# Patient Record
Sex: Male | Born: 1960 | Race: Black or African American | Hispanic: No | Marital: Single | Smoking: Current some day smoker
Health system: Southern US, Community
[De-identification: ages and names within clinical notes are randomized; demographics above are authoritative.]

## PROBLEM LIST (undated history)

## (undated) DIAGNOSIS — I509 Heart failure, unspecified: Secondary | ICD-10-CM

---

## 2001-06-04 ENCOUNTER — Emergency Department (HOSPITAL_COMMUNITY): Admission: EM | Admit: 2001-06-04 | Discharge: 2001-06-04 | Payer: Self-pay | Admitting: *Deleted

## 2002-01-14 ENCOUNTER — Encounter: Payer: Self-pay | Admitting: Emergency Medicine

## 2002-01-14 ENCOUNTER — Emergency Department (HOSPITAL_COMMUNITY): Admission: EM | Admit: 2002-01-14 | Discharge: 2002-01-14 | Payer: Self-pay | Admitting: Emergency Medicine

## 2002-04-17 ENCOUNTER — Emergency Department (HOSPITAL_COMMUNITY): Admission: EM | Admit: 2002-04-17 | Discharge: 2002-04-17 | Payer: Self-pay | Admitting: Emergency Medicine

## 2003-07-17 ENCOUNTER — Emergency Department (HOSPITAL_COMMUNITY): Admission: EM | Admit: 2003-07-17 | Discharge: 2003-07-17 | Payer: Self-pay | Admitting: *Deleted

## 2004-07-08 ENCOUNTER — Emergency Department (HOSPITAL_COMMUNITY): Admission: EM | Admit: 2004-07-08 | Discharge: 2004-07-08 | Payer: Self-pay | Admitting: Emergency Medicine

## 2004-08-27 ENCOUNTER — Emergency Department (HOSPITAL_COMMUNITY): Admission: EM | Admit: 2004-08-27 | Discharge: 2004-08-27 | Payer: Self-pay | Admitting: Emergency Medicine

## 2007-04-23 ENCOUNTER — Emergency Department (HOSPITAL_COMMUNITY): Admission: EM | Admit: 2007-04-23 | Discharge: 2007-04-23 | Payer: Self-pay | Admitting: Emergency Medicine

## 2008-05-25 ENCOUNTER — Emergency Department (HOSPITAL_COMMUNITY): Admission: EM | Admit: 2008-05-25 | Discharge: 2008-05-25 | Payer: Self-pay | Admitting: Emergency Medicine

## 2009-02-15 ENCOUNTER — Emergency Department (HOSPITAL_COMMUNITY): Admission: EM | Admit: 2009-02-15 | Discharge: 2009-02-15 | Payer: Self-pay | Admitting: Emergency Medicine

## 2011-04-07 LAB — RAPID URINE DRUG SCREEN, HOSP PERFORMED
Amphetamines: NOT DETECTED
Barbiturates: NOT DETECTED
Benzodiazepines: NOT DETECTED
Cocaine: POSITIVE — AB
Opiates: NOT DETECTED
Tetrahydrocannabinol: NOT DETECTED

## 2011-04-07 LAB — ETHANOL: Alcohol, Ethyl (B): 227 mg/dL — ABNORMAL HIGH (ref 0–10)

## 2011-07-12 DIAGNOSIS — F17200 Nicotine dependence, unspecified, uncomplicated: Secondary | ICD-10-CM | POA: Insufficient documentation

## 2011-07-12 DIAGNOSIS — M25519 Pain in unspecified shoulder: Secondary | ICD-10-CM | POA: Insufficient documentation

## 2011-07-12 DIAGNOSIS — G90519 Complex regional pain syndrome I of unspecified upper limb: Secondary | ICD-10-CM | POA: Insufficient documentation

## 2011-07-12 DIAGNOSIS — R29898 Other symptoms and signs involving the musculoskeletal system: Secondary | ICD-10-CM | POA: Insufficient documentation

## 2011-07-13 ENCOUNTER — Encounter: Payer: Self-pay | Admitting: *Deleted

## 2011-07-13 ENCOUNTER — Emergency Department (HOSPITAL_COMMUNITY)
Admission: EM | Admit: 2011-07-13 | Discharge: 2011-07-13 | Disposition: A | Discharge status: Home / Self Care | Payer: Medicare Other | Attending: Emergency Medicine | Admitting: Emergency Medicine

## 2011-07-13 ENCOUNTER — Emergency Department (HOSPITAL_COMMUNITY): Payer: Medicare Other

## 2011-07-13 DIAGNOSIS — G90512 Complex regional pain syndrome I of left upper limb: Secondary | ICD-10-CM

## 2011-07-13 NOTE — ED Notes (Signed)
Pt wandering around department.  Was asked to return to his room.  Pt states "I'm gone. They told me I can't walk around."  Pt left prior to being seen by EDP.

## 2011-07-13 NOTE — ED Notes (Signed)
Pt c/o pain to left shoulder x few months; pt states he used to lift weights

## 2011-07-13 NOTE — ED Provider Notes (Addendum)
History    patient complains of left shoulder and left upper extremity weakness. Patient appears to be intoxicated tonight. States that he had just gotten out of prison recently. Rule out so exercises and prison which irritated his left upper extremity. His family is interested that he may have had a stroke, however patient feels that this is more musculoskeletal pain. No fever or chills or stiff neck. No obvious mental status changes. He is not slurring his words. He is moving all 4 extremities. Facial symmetry is normal. He is known to have a drinking problem at times.  Chief Complaint  Patient presents with  . Shoulder Pain   Patient is a 50 y.o. male presenting with shoulder pain. The history is provided by the patient. History Limited By: intoxication.  Shoulder Pain    Past Medical History  Diagnosis Date  . Asthma     History reviewed. No pertinent past surgical history.  History reviewed. No pertinent family history.  History  Substance Use Topics  . Smoking status: Current Some Day Smoker  . Smokeless tobacco: Not on file  . Alcohol Use: Yes      Review of Systems  All other systems reviewed and are negative.    Physical Exam  BP 121/79  Pulse 81  Temp(Src) 97.6 F (36.4 C) (Oral)  Resp 20  Ht 5\' 6"  (1.676 m)  Wt 120 lb (54.432 kg)  BMI 19.37 kg/m2  SpO2 96%  Physical Exam  Nursing note and vitals reviewed. Constitutional: He is oriented to person, place, and time. He appears well-developed and well-nourished.  HENT:  Head: Normocephalic and atraumatic.  Eyes: Conjunctivae and EOM are normal. Pupils are equal, round, and reactive to light.  Neck: Normal range of motion. Neck supple.  Cardiovascular: Normal rate and regular rhythm.   Pulmonary/Chest: Effort normal and breath sounds normal.  Abdominal: Soft. Bowel sounds are normal.  Musculoskeletal: Normal range of motion.       Able to move left upper extremity with full flexion and extension. Minimal  tenderness tenderness surrounding the rotator cuff. Neurovascular intact.  Neurological: He is alert and oriented to person, place, and time.       Patient appears intoxicated but he is alert. He is ambulatory without neurological deficits.  Skin: Skin is warm and dry.  Psychiatric: He has a normal mood and affect.    ED Course  Procedures  MDM Patient's concern about left upper extremity pain and weakness has been going on for several months. It appears to be a musculoskeletal problem. A CT of his head at the family's request. This was negative. Patient was discharged home     Donnetta Hutching, MD 07/13/11 1610  Donnetta Hutching, MD 07/13/11 [phone]

## 2011-07-13 NOTE — ED Notes (Signed)
edp in with pt 

## 2011-11-24 ENCOUNTER — Emergency Department (HOSPITAL_COMMUNITY): Payer: Medicare Other

## 2011-11-24 ENCOUNTER — Encounter (HOSPITAL_COMMUNITY): Payer: Self-pay | Admitting: *Deleted

## 2011-11-24 ENCOUNTER — Emergency Department (HOSPITAL_COMMUNITY)
Admission: EM | Admit: 2011-11-24 | Discharge: 2011-11-24 | Discharge status: Against Medical Advice | Payer: Medicare Other | Attending: Emergency Medicine | Admitting: Emergency Medicine

## 2011-11-24 DIAGNOSIS — R51 Headache: Secondary | ICD-10-CM | POA: Insufficient documentation

## 2011-11-24 DIAGNOSIS — Z139 Encounter for screening, unspecified: Secondary | ICD-10-CM

## 2011-11-24 DIAGNOSIS — F1010 Alcohol abuse, uncomplicated: Secondary | ICD-10-CM | POA: Insufficient documentation

## 2011-11-24 NOTE — ED Notes (Signed)
Pt resting comfortably with eyes closed, equal bil chest rise and fall.  nad noted.

## 2011-11-24 NOTE — ED Notes (Signed)
In via ems. Pt states his head hurts, pt intoxicated. crying

## 2011-11-24 NOTE — ED Notes (Signed)
edp in to assess pt - pt refused to talk to edp.  approx 5 min later - pt walked out of department, security called to escort pt back to bed.  Pt returned, yelling, cursing stating, " I have been here 4 hours.  I can leave and find me a doctor."  RN encouraged pt to stay and allow lab to draw blood.  Pt refused, walked out of department again.  EDP notified and wanted [employer] called to do a welfare check on pt.  Will do so.

## 2011-11-25 NOTE — ED Provider Notes (Signed)
History     CSN: [id number] Arrival date & time: 11/24/2011  2:05 PM    Chief Complaint  Patient presents with  . Alcohol Intoxication   Patient is a 50 y.o. male presenting with intoxication. The history is provided by the EMS personnel. History Limited By: uncooperative.  Alcohol Intoxication  Pt was seen at 1625.  Per EMS and pt, states he came to the ED because he felt "a knot" on his head and "wants it checked out."  States it has been there "for years."  Denies injury, no CP/SOB, no abd pain.  States he "just had a few beers" earlier today.     Past Medical History  Diagnosis Date  . Asthma     History reviewed. No pertinent past surgical history.   History  Substance Use Topics  . Smoking status: Current Some Day Smoker  . Smokeless tobacco: Not on file  . Alcohol Use: Yes    Review of Systems  Unable to perform ROS: Other    Allergies  Review of patient's allergies indicates no known allergies.  Home Medications   Current Outpatient Rx  Name Route Sig Dispense Refill  . ASPIRIN 81 MG PO TABS Oral Take 81 mg by mouth daily.        BP 115/76  Pulse 89  Temp(Src) 97.6 F (36.4 C) (Oral)  Resp 20  SpO2 98%  Physical Exam 1630: Physical examination:  Nursing notes reviewed; Vital signs and O2 SAT reviewed;  Constitutional: Well developed, Well nourished, Well hydrated, In no acute distress; Head:  Normocephalic, atraumatic; Eyes: EOMI, PERRL, No scleral icterus; ENMT: Mouth and pharynx normal, Mucous membranes moist; Neck: Supple, Full range of motion, No lymphadenopathy; Cardiovascular: Regular rate and rhythm, No murmur, rub, or gallop; Respiratory: Breath sounds clear & equal bilaterally, No rales, rhonchi, wheezes, or rub, Normal respiratory effort/excursion; Chest: Nontender, Movement normal; Abdomen: Soft, Nontender, Nondistended, Normal bowel sounds; Extremities: Pulses normal, No tenderness, No edema,.; Neuro: AA&Ox3, Major CN grossly intact. No facial  droop, speech clea. Climbs on and off stretcher by himself without difficulty and ambulates around ED with steady gait. Moves all ext well spontaneously without obvious focal motor deficits; Skin: Color normal, Warm, Dry, no rash.    ED Course  Procedures   MDM  MDM Reviewed: nursing note and vitals    1700:  Pt apparently cursed at the ED staff and walked out of the ED shortly after my eval. Security cannot find pt.  ED RN called [employer]; apparently they know pt well, they will do welfare check on pt.      Curby Carswell Allison Quarry, DO 11/26/11 1327

## 2012-12-05 ENCOUNTER — Emergency Department (HOSPITAL_COMMUNITY): Payer: Medicare Other

## 2012-12-05 ENCOUNTER — Other Ambulatory Visit: Payer: Self-pay

## 2012-12-05 ENCOUNTER — Emergency Department (HOSPITAL_COMMUNITY)
Admission: EM | Admit: 2012-12-05 | Discharge: 2012-12-05 | Discharge status: Against Medical Advice | Payer: Medicare Other | Attending: Emergency Medicine | Admitting: Emergency Medicine

## 2012-12-05 DIAGNOSIS — S0093XA Contusion of unspecified part of head, initial encounter: Secondary | ICD-10-CM

## 2012-12-05 DIAGNOSIS — S0003XA Contusion of scalp, initial encounter: Secondary | ICD-10-CM | POA: Insufficient documentation

## 2012-12-05 DIAGNOSIS — F1010 Alcohol abuse, uncomplicated: Secondary | ICD-10-CM | POA: Insufficient documentation

## 2012-12-05 DIAGNOSIS — S1093XA Contusion of unspecified part of neck, initial encounter: Secondary | ICD-10-CM | POA: Insufficient documentation

## 2012-12-05 NOTE — ED Notes (Signed)
Pt rambles about his medicaid and having a bad heart, states my heart hurts.
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
## 2012-12-05 NOTE — ED Notes (Signed)
Walked out of emergency department AMA.

## 2012-12-05 NOTE — ED Notes (Signed)
Pt up out of bed, states that he is leaving and going home, states "can't nobody make me stay here and do nothing I don't want to do," MD made aware of pt statements.  Security notified to come to pt bedside.
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
## 2012-12-05 NOTE — ED Notes (Signed)
Pt states he refuses for blood work to be drawn.

## 2012-12-05 NOTE — ED Notes (Signed)
Pt states that he takes hydrocodone for his right shoulder pain, pt is difficult to understand, states he is on disability for a bad heart, and is deaf in his left ear.
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
## 2013-06-03 ENCOUNTER — Emergency Department (HOSPITAL_COMMUNITY)
Admission: EM | Admit: 2013-06-03 | Discharge: 2013-06-03 | Discharge status: Against Medical Advice | Payer: Medicare Other | Attending: Emergency Medicine | Admitting: Emergency Medicine

## 2013-06-03 ENCOUNTER — Emergency Department (HOSPITAL_COMMUNITY): Payer: Medicare Other

## 2013-06-03 ENCOUNTER — Encounter (HOSPITAL_COMMUNITY): Payer: Self-pay

## 2013-06-03 DIAGNOSIS — Z79899 Other long term (current) drug therapy: Secondary | ICD-10-CM | POA: Insufficient documentation

## 2013-06-03 DIAGNOSIS — S30860A Insect bite (nonvenomous) of lower back and pelvis, initial encounter: Secondary | ICD-10-CM | POA: Insufficient documentation

## 2013-06-03 DIAGNOSIS — Y939 Activity, unspecified: Secondary | ICD-10-CM | POA: Insufficient documentation

## 2013-06-03 DIAGNOSIS — J45901 Unspecified asthma with (acute) exacerbation: Secondary | ICD-10-CM | POA: Insufficient documentation

## 2013-06-03 DIAGNOSIS — R0602 Shortness of breath: Secondary | ICD-10-CM | POA: Insufficient documentation

## 2013-06-03 DIAGNOSIS — R0989 Other specified symptoms and signs involving the circulatory and respiratory systems: Secondary | ICD-10-CM | POA: Insufficient documentation

## 2013-06-03 DIAGNOSIS — R0789 Other chest pain: Secondary | ICD-10-CM | POA: Insufficient documentation

## 2013-06-03 DIAGNOSIS — W57XXXA Bitten or stung by nonvenomous insect and other nonvenomous arthropods, initial encounter: Secondary | ICD-10-CM | POA: Insufficient documentation

## 2013-06-03 DIAGNOSIS — Y929 Unspecified place or not applicable: Secondary | ICD-10-CM | POA: Insufficient documentation

## 2013-06-03 DIAGNOSIS — F17200 Nicotine dependence, unspecified, uncomplicated: Secondary | ICD-10-CM | POA: Insufficient documentation

## 2013-06-03 DIAGNOSIS — R21 Rash and other nonspecific skin eruption: Secondary | ICD-10-CM | POA: Insufficient documentation

## 2013-06-03 DIAGNOSIS — R0600 Dyspnea, unspecified: Secondary | ICD-10-CM

## 2013-06-03 DIAGNOSIS — R0609 Other forms of dyspnea: Secondary | ICD-10-CM | POA: Insufficient documentation

## 2013-06-03 NOTE — ED Provider Notes (Signed)
History    This chart was scribed for [employer]. Rubin Payor, MD by Sofie Rower, ED Scribe. The patient was seen in room APA05/APA05 and the patient's care was started at 8:36PM.    CSN: [id number]  Arrival date & time 06/03/13  2002   First MD Initiated Contact with Patient 06/03/13 2036      Chief Complaint  Patient presents with  . Tick Removal  . Shortness of Breath  . Asthma    (Consider location/radiation/quality/duration/timing/severity/associated sxs/prior treatment) The history is provided by the patient. No language interpreter was used.    Evan Ferguson is a 52 y.o. male , with a hx of asthma, who presents to the Emergency Department complaining of  sudden, progressively worsening, tick bite, located at the left side of the abdomen, onset four days ago (05/30/13).  Associated symptoms include swelling and erythema located at the left side of the abdomen. The pt reports he was recently bit by a tick, four days ago, which he proceeded to remove himself, further noticing swelling and redness at the bite site today. The pt also complains of shortness of breath, which he believes, may be related to his asthma.   The pt denies fever.   The pt is a current some day smoker, in addition to drinking alcohol.   Pt does not have a PCP.   Past Medical History  Diagnosis Date  . Asthma     History reviewed. No pertinent past surgical history.  No family history on file.  History  Substance Use Topics  . Smoking status: Current Some Day Smoker  . Smokeless tobacco: Not on file  . Alcohol Use: Yes      Review of Systems  Constitutional: Negative for fever.  Respiratory: Positive for shortness of breath.   All other systems reviewed and are negative.    Allergies  Review of patient's allergies indicates no known allergies.  Home Medications   Current Outpatient Rx  Name  Route  Sig  Dispense  Refill  . albuterol (VENTOLIN HFA) 108 (90 BASE) MCG/ACT inhaler    Inhalation   Inhale 2 puffs into the lungs every 6 (six) hours as needed for wheezing or shortness of breath.           Pulse 77  Temp(Src) 98.8 F (37.1 C) (Oral)  Resp 18  Ht 5\' 5"  (1.651 m)  Wt 120 lb (54.432 kg)  BMI 19.97 kg/m2  SpO2 98%  Physical Exam  Nursing note and vitals reviewed. Constitutional: He is oriented to person, place, and time. He appears well-developed and well-nourished. No distress.  Pt is thin.   HENT:  Head: Normocephalic and atraumatic.  Eyes: EOM are normal. Pupils are equal, round, and reactive to light.  Neck: Neck supple. No tracheal deviation present.  Cardiovascular: Normal rate, regular rhythm and normal heart sounds.   Pulmonary/Chest: Effort normal and breath sounds normal. No respiratory distress.  Abdominal: Soft. He exhibits no distension.  Musculoskeletal: Normal range of motion. He exhibits no edema.  Neurological: He is alert and oriented to person, place, and time. No sensory deficit.  Skin: Skin is warm and dry. Rash noted. There is erythema.  1.5 cm raised erythematous lesion located at the left abdomen. Scab present. No fluctuance.   Psychiatric: He has a normal mood and affect. His behavior is normal.    ED Course  Procedures (including critical care time)  DIAGNOSTIC STUDIES:  COORDINATION OF CARE:   8:40 PM- Treatment plan discussed with  patient. Pt agrees with treatment.     Labs Reviewed  CBC WITH DIFFERENTIAL  TROPONIN I   No results found.   1. Tick bite   2. Dyspnea       MDM  Patient presents for tick bite and shortness of breath with occasional chest pain. Patient would have been given antibiotics for a tick bite. He was going to get to a workup for his chest pain shortness of breath. Patient left AMA the workup could be done.      I personally performed the services described in this documentation, which was scribed in my presence. The recorded information has been reviewed and is  accurate.     Juliet Rude. Rubin Payor, MD 06/03/13 [phone]

## 2013-06-03 NOTE — ED Notes (Signed)
i was getting pts ekg ready and pt must have left room. Gown on bed and pt was gone.

## 2013-06-03 NOTE — ED Notes (Signed)
Tick bite to left abdomen per pt. Swelling and redness noted.

## 2014-09-01 ENCOUNTER — Emergency Department (HOSPITAL_COMMUNITY): Payer: Medicare Other

## 2014-09-01 ENCOUNTER — Emergency Department (HOSPITAL_COMMUNITY)
Admission: EM | Admit: 2014-09-01 | Discharge: 2014-09-01 | Disposition: A | Discharge status: Home / Self Care | Payer: Medicare Other | Attending: Emergency Medicine | Admitting: Emergency Medicine

## 2014-09-01 ENCOUNTER — Encounter (HOSPITAL_COMMUNITY): Payer: Self-pay | Admitting: Emergency Medicine

## 2014-09-01 DIAGNOSIS — F17200 Nicotine dependence, unspecified, uncomplicated: Secondary | ICD-10-CM | POA: Diagnosis not present

## 2014-09-01 DIAGNOSIS — Z79899 Other long term (current) drug therapy: Secondary | ICD-10-CM | POA: Insufficient documentation

## 2014-09-01 DIAGNOSIS — R071 Chest pain on breathing: Secondary | ICD-10-CM | POA: Insufficient documentation

## 2014-09-01 DIAGNOSIS — J45909 Unspecified asthma, uncomplicated: Secondary | ICD-10-CM | POA: Insufficient documentation

## 2014-09-01 DIAGNOSIS — I509 Heart failure, unspecified: Secondary | ICD-10-CM | POA: Insufficient documentation

## 2014-09-01 DIAGNOSIS — R079 Chest pain, unspecified: Secondary | ICD-10-CM | POA: Diagnosis present

## 2014-09-01 DIAGNOSIS — R0789 Other chest pain: Secondary | ICD-10-CM

## 2014-09-01 HISTORY — DX: Heart failure, unspecified: I50.9

## 2014-09-01 LAB — TROPONIN I: Troponin I: <0.3 ng/mL (ref <0.30)

## 2014-09-01 LAB — CBC WITH DIFFERENTIAL/PLATELET
Basophils Absolute: 0 10*3/uL (ref 0.0–0.1)
Basophils Relative: 0 % (ref 0–1)
Eosinophils Absolute: 0.1 10*3/uL (ref 0.0–0.7)
Eosinophils Relative: 2 % (ref 0–5)
HCT: 44.7 % (ref 39.0–52.0)
Hemoglobin: 15.9 g/dL (ref 13.0–17.0)
Lymphocytes Relative: 27 % (ref 12–46)
Lymphs Abs: 1.6 10*3/uL (ref 0.7–4.0)
MCH: 30.8 pg (ref 26.0–34.0)
MCHC: 35.6 g/dL (ref 30.0–36.0)
MCV: 86.5 fL (ref 78.0–100.0)
Monocytes Absolute: 0.6 10*3/uL (ref 0.1–1.0)
Monocytes Relative: 10 % (ref 3–12)
Neutro Abs: 3.5 10*3/uL (ref 1.7–7.7)
Neutrophils Relative %: 61 % (ref 43–77)
Platelets: 267 10*3/uL (ref 150–400)
RBC: 5.17 MIL/uL (ref 4.22–5.81)
RDW: 13.6 % (ref 11.5–15.5)
WBC: 5.8 10*3/uL (ref 4.0–10.5)

## 2014-09-01 LAB — BASIC METABOLIC PANEL
Anion gap: 12 (ref 5–15)
BUN: 9 mg/dL (ref 6–23)
CO2: 25 mEq/L (ref 19–32)
Calcium: 9.8 mg/dL (ref 8.4–10.5)
Chloride: 103 mEq/L (ref 96–112)
Creatinine, Ser: 0.79 mg/dL (ref 0.50–1.35)
GFR calc Af Amer: >90 mL/min (ref >90)
GFR calc non Af Amer: >90 mL/min (ref >90)
Glucose, Bld: 117 mg/dL — ABNORMAL HIGH (ref 70–99)
Potassium: 4.3 mEq/L (ref 3.7–5.3)
Sodium: 140 mEq/L (ref 137–147)

## 2014-09-01 MED ORDER — OXYCODONE-ACETAMINOPHEN 5-325 MG PO TABS
1.0000 | ORAL_TABLET | Freq: Once | ORAL | Status: AC
  Administered 2014-09-01: 1 via ORAL
  Filled 2014-09-01: qty 1

## 2014-09-01 MED ORDER — NAPROXEN 500 MG PO TABS: 500.0000 mg | ORAL_TABLET | Freq: Two times a day (BID) | ORAL | Status: AC

## 2014-09-01 MED ORDER — KETOROLAC TROMETHAMINE 60 MG/2ML IM SOLN: 60.0000 mg | Freq: Once | INTRAMUSCULAR | Status: DC

## 2014-09-01 MED ORDER — CYCLOBENZAPRINE HCL 10 MG PO TABS: 10.0000 mg | ORAL_TABLET | Freq: Two times a day (BID) | ORAL | Status: AC | PRN

## 2014-09-01 NOTE — Discharge Instructions (Signed)
Chest Wall Pain Chest wall pain is pain felt in or around the chest bones and muscles. It may take up to 6 weeks to get better. It may take longer if you are active. Chest wall pain can happen on its own. Other times, things like germs, injury, coughing, or exercise can cause the pain. HOME CARE   Avoid activities that make you tired or cause pain. Try not to use your chest, belly (abdominal), or side muscles. Do not use heavy weights.  Put ice on the sore area.  Put ice in a plastic bag.  Place a towel between your skin and the bag.  Leave the ice on for 15-20 minutes for the first 2 days.  Only take medicine as told by your doctor. GET HELP RIGHT AWAY IF:   You have more pain or are very uncomfortable.  You have a fever.  Your chest pain gets worse.  You have new problems.  You feel sick to your stomach (nauseous) or throw up (vomit).  You start to sweat or feel lightheaded.  You have a cough with mucus (phlegm).  You cough up blood. MAKE SURE YOU:   Understand these instructions.  Will watch your condition.  Will get help right away if you are not doing well or get worse. Document Released: 05/25/2008 Document Revised: 02/29/2012 Document Reviewed: 08/03/2011 [facility] Patient Information 2015 [place], [place]. This information is not intended to replace advice given to you by your health care provider. Make sure you discuss any questions you have with your health care provider.   Pain is most likely in the wall of your chest. Medication for pain and muscle relaxation. Followup your regular Dr.

## 2014-09-01 NOTE — ED Notes (Signed)
Pt c/o pain to left chest that is sore. Pain is worse with movement and palpation. Symptoms x 2 days. Pt states cough and sob that he has been experiencing is chronic and no other new symptoms since pain to chest started. Denies radiation. Pt is non diaphoretic and nad at this time. Pt has prod cough noted but states cant cough hard due to increased pain to chest.

## 2014-09-01 NOTE — ED Provider Notes (Signed)
CSN: [id number]     Arrival date & time 09/01/14  1106 History   First MD Initiated Contact with Patient 09/01/14 1112    This chart was scribed for Donnetta Hutching, MD by Freida Busman, ED Scribe. This patient was seen in room APA03/APA03 and the patient's care was started 11:37 AM.  Chief Complaint  Patient presents with  . Chest Pain    The history is provided by the patient.    HPI Comments:  Evan Ferguson is a 52 y.o. male who presents to the Emergency Department complaining of moderate  left sided chest pain that started three days ago. Pain has been constant but waxes and wanes in severity, pt reports increased pain with movement. Denies h/o MI. Pt also denies associated diaphorsis, nausea and acute trouble breathing. No alleviating factors noted. Pt admits to frequent alcohol use and smokes about 1/2 ppd.   Past Medical History  Diagnosis Date  . Asthma   . CHF (congestive heart failure)    History reviewed. No pertinent past surgical history. History reviewed. No pertinent family history. History  Substance Use Topics  . Smoking status: Current Some Day Smoker    Types: Cigarettes  . Smokeless tobacco: Not on file  . Alcohol Use: Yes     Comment: beer-2-3 cans a day    Review of Systems  All other systems reviewed and are negative.   10 systems reviewed and negative other than pertinent ROS in HPI    Allergies  Review of patient's allergies indicates no known allergies.  Home Medications   Prior to Admission medications   Medication Sig Start Date End Date Taking? Authorizing Provider  albuterol (VENTOLIN HFA) 108 (90 BASE) MCG/ACT inhaler Inhale 2 puffs into the lungs every 6 (six) hours as needed for wheezing or shortness of breath.    Historical Provider, MD  cyclobenzaprine (FLEXERIL) 10 MG tablet Take 1 tablet (10 mg total) by mouth 2 (two) times daily as needed for muscle spasms. 09/01/14   Donnetta Hutching, MD  naproxen (NAPROSYN) 500 MG tablet Take 1 tablet (500  mg total) by mouth 2 (two) times daily. 09/01/14   Donnetta Hutching, MD   BP 145/95  Pulse 66  Temp(Src) 98.1 F (36.7 C) (Oral)  Resp 19  SpO2 95% Physical Exam  Nursing note and vitals reviewed. Constitutional: He is oriented to person, place, and time. He appears well-developed and well-nourished.  HENT:  Head: Normocephalic and atraumatic.  Eyes: Conjunctivae and EOM are normal. Pupils are equal, round, and reactive to light.  Neck: Normal range of motion. Neck supple.  Cardiovascular: Normal rate, regular rhythm and normal heart sounds.   Pulmonary/Chest: Effort normal and breath sounds normal.  Left chest wall tenderness  Abdominal: Soft. Bowel sounds are normal.  Musculoskeletal: Normal range of motion.  Neurological: He is alert and oriented to person, place, and time.  Skin: Skin is warm and dry.  Psychiatric: He has a normal mood and affect. His behavior is normal.    ED Course  Procedures (including critical care time)   DIAGNOSTIC STUDIES:  Oxygen Saturation is 95% on RA, adequate by my interpretation.    COORDINATION OF CARE:  11:43 AM Discussed treatment plan with pt at bedside and pt agreed to plan.  Labs Review Labs Reviewed  BASIC METABOLIC PANEL - Abnormal; Notable for the following:    Glucose, Bld 117 (*)    All other components within normal limits  CBC WITH DIFFERENTIAL  TROPONIN I  Imaging Review Dg Chest 2 View  09/01/2014   CLINICAL DATA:  Chest pain.  EXAM: CHEST  2 VIEW  COMPARISON:  None.  FINDINGS: The heart size and mediastinal contours are within normal limits. Both lungs are clear. The visualized skeletal structures are unremarkable.  IMPRESSION: Normal chest.   Electronically Signed   By: Geanie Cooley M.D.   On: 09/01/2014 12:10     EKG Interpretation   Date/Time:  Saturday September 01 2014 11:20:14 EDT Ventricular Rate:  74 PR Interval:  187 QRS Duration: 76 QT Interval:  390 QTC Calculation: 433 R Axis:   81 Text  Interpretation:  Sinus rhythm Abnrm T, consider ischemia,  anterolateral lds ST elev, probable normal early repol pattern Confirmed  by Cavin Longman  MD, Caliegh Middlekauff ([id number]) on 09/01/2014 11:46:27 AM      MDM   Final diagnoses:  Chest wall pain  Atypical chest pain most consistent with chest wall pain. Pain is worse with movement and palpation. No dyspnea, diaphoresis, nausea.  EKG, chest x-ray, troponin all negative.   Discharge medications Naprosyn 5 mg and Flexeril 10 mg I personally performed the services described in this documentation, which was scribed in my presence. The recorded information has been reviewed and is accurate.     Donnetta Hutching, MD 09/01/14 1434

## 2014-09-01 NOTE — ED Notes (Addendum)
Pt states he hurst "a little bit" but rates pain 9 on scale. In to dc. Pt requesting pain med prior to leaving. edp aware.

## 2016-04-22 ENCOUNTER — Encounter (HOSPITAL_COMMUNITY): Payer: Self-pay | Admitting: Emergency Medicine

## 2016-04-22 ENCOUNTER — Emergency Department (HOSPITAL_COMMUNITY)
Admission: EM | Admit: 2016-04-22 | Discharge: 2016-04-22 | Disposition: A | Discharge status: Home / Self Care | Payer: Medicare Other | Attending: Emergency Medicine | Admitting: Emergency Medicine

## 2016-04-22 DIAGNOSIS — F17210 Nicotine dependence, cigarettes, uncomplicated: Secondary | ICD-10-CM | POA: Diagnosis not present

## 2016-04-22 DIAGNOSIS — J45909 Unspecified asthma, uncomplicated: Secondary | ICD-10-CM | POA: Insufficient documentation

## 2016-04-22 DIAGNOSIS — S40862A Insect bite (nonvenomous) of left upper arm, initial encounter: Secondary | ICD-10-CM | POA: Insufficient documentation

## 2016-04-22 DIAGNOSIS — Y939 Activity, unspecified: Secondary | ICD-10-CM | POA: Diagnosis not present

## 2016-04-22 DIAGNOSIS — Y929 Unspecified place or not applicable: Secondary | ICD-10-CM | POA: Diagnosis not present

## 2016-04-22 DIAGNOSIS — I509 Heart failure, unspecified: Secondary | ICD-10-CM | POA: Diagnosis not present

## 2016-04-22 DIAGNOSIS — W57XXXA Bitten or stung by nonvenomous insect and other nonvenomous arthropods, initial encounter: Secondary | ICD-10-CM | POA: Insufficient documentation

## 2016-04-22 DIAGNOSIS — Y999 Unspecified external cause status: Secondary | ICD-10-CM | POA: Insufficient documentation

## 2016-04-22 NOTE — ED Notes (Signed)
Patient verbalizes understanding of wound care. And follow up care if needed. Patient out of department at this time.

## 2016-04-22 NOTE — Discharge Instructions (Signed)
Tick Bite Information °Ticks are insects that attach themselves to the skin. There are many types of ticks. Common types include wood ticks and deer ticks. Sometimes, ticks carry diseases that can make a person very ill. The most common places for ticks to attach themselves are the scalp, neck, armpits, waist, and groin.  °HOW CAN YOU PREVENT TICK BITES? °Take these steps to help prevent tick bites when you are outdoors: °· Wear long sleeves and long pants. °· Wear white clothes so you can see ticks more easily. °· Tuck your pant legs into your socks. °· If walking on a trail, stay in the middle of the trail to avoid brushing against bushes. °· Avoid walking through areas with long grass. °· Put bug spray on all skin that is showing and along boot tops, pant legs, and sleeve cuffs. °· Check clothes, hair, and skin often and before going inside. °· Brush off any ticks that are not attached. °· Take a shower or bath as soon as possible after being outdoors. °HOW SHOULD YOU REMOVE A TICK? °Ticks should be removed as soon as possible to help prevent diseases. °1. If latex gloves are available, put them on before trying to remove a tick. °2. Use tweezers to grasp the tick as close to the skin as possible. You may also use curved forceps or a tick removal tool. Grasp the tick as close to its head as possible. Avoid grasping the tick on its body. °3. Pull gently upward until the tick lets go. Do not twist the tick or jerk it suddenly. This may break off the tick's head or mouth parts. °4. Do not squeeze or crush the tick's body. This could force disease-carrying fluids from the tick into your body. °5. After the tick is removed, wash the bite area and your hands with soap and water or alcohol. °6. Apply a small amount of antiseptic cream or ointment to the bite site. °7. Wash any tools that were used. °Do not try to remove a tick by applying a hot match, petroleum jelly, or fingernail polish to the tick. These methods do  not work. They may also increase the chances of disease being spread from the tick bite. °WHEN SHOULD YOU SEEK HELP? °Contact your health care provider if you are unable to remove a tick or if a part of the tick breaks off in the skin. °After a tick bite, you need to watch for signs and symptoms of diseases that can be spread by ticks. Contact your health care provider if you develop any of the following: °· Fever. °· Rash. °· Redness and puffiness (swelling) in the area of the tick bite. °· Tender, puffy lymph glands. °· Watery poop (diarrhea). °· Weight loss. °· Cough. °· Feeling more tired than normal (fatigue). °· Muscle, joint, or bone pain. °· Belly (abdominal) pain. °· Headache. °· Change in your level of consciousness. °· Trouble walking or moving your legs. °· Loss of feeling (numbness) in the legs. °· Loss of movement (paralysis). °· Shortness of breath. °· Confusion. °· Throwing up (vomiting) many times. °  °This information is not intended to replace advice given to you by your health care provider. Make sure you discuss any questions you have with your health care provider. °  °Document Released: 03/03/2010 Document Revised: 08/09/2013 Document Reviewed: 05/17/2013 °Elsevier Interactive Patient Education ©2016 [employer] ° °

## 2016-04-22 NOTE — ED Notes (Addendum)
Pt reports found tick to left lateral axilla yesterday. Tick still noted in site. Pt denies n/v/fever. nad noted.

## 2016-04-25 NOTE — ED Provider Notes (Signed)
CSN: [id number]     Arrival date & time 04/22/16  1019 History   First MD Initiated Contact with Patient 04/22/16 1044     Chief Complaint  Patient presents with  . Tick Removal     (Consider location/radiation/quality/duration/timing/severity/associated sxs/prior Treatment) HPI   Evan Ferguson is a 55 y.o. male who presents to the Emergency Department complaining of a tick bite to left axilla.  He states he is unsure when the bite occurred but noticed the tick earlier on the day of arrival.  He has not tried to remove it.  He complains of pain at the site.  He denies rash, joint pains, fever and chills.     Past Medical History  Diagnosis Date  . Asthma   . CHF (congestive heart failure) (HCC)    History reviewed. No pertinent past surgical history. History reviewed. No pertinent family history. Social History  Substance Use Topics  . Smoking status: Current Some Day Smoker    Types: Cigarettes  . Smokeless tobacco: None  . Alcohol Use: Yes     Comment: beer-2-3 cans a day    Review of Systems  Constitutional: Negative for fever, chills, activity change and appetite change.  Respiratory: Negative for shortness of breath.   Musculoskeletal: Negative for arthralgias.  Skin: Negative for rash and wound.       Tick bite left axilla   Neurological: Negative for dizziness, weakness, numbness and headaches.  All other systems reviewed and are negative.     Allergies  Review of patient's allergies indicates no known allergies.  Home Medications   Prior to Admission medications   Medication Sig Start Date End Date Taking? Authorizing Provider  albuterol (VENTOLIN HFA) 108 (90 BASE) MCG/ACT inhaler Inhale 2 puffs into the lungs every 6 (six) hours as needed for wheezing or shortness of breath.    Historical Provider, MD  cyclobenzaprine (FLEXERIL) 10 MG tablet Take 1 tablet (10 mg total) by mouth 2 (two) times daily as needed for muscle spasms. 09/01/14   Donnetta Hutching, MD   naproxen (NAPROSYN) 500 MG tablet Take 1 tablet (500 mg total) by mouth 2 (two) times daily. 09/01/14   Donnetta Hutching, MD   BP 118/95 mmHg  Pulse 70  Temp(Src) 98.5 F (36.9 C) (Oral)  Resp 18  Ht  (1.575 m)  Wt 54.432 kg  BMI 21.94 kg/m2  SpO2 97% Physical Exam  Constitutional: He is oriented to person, place, and time. He appears well-developed and well-nourished. No distress.  HENT:  Head: Normocephalic and atraumatic.  Mouth/Throat: Oropharynx is clear and moist.  Neck: Normal range of motion.  Cardiovascular: Normal rate, regular rhythm, normal heart sounds and intact distal pulses.   No murmur heard. Pulmonary/Chest: Effort normal and breath sounds normal. No respiratory distress.  Musculoskeletal: Normal range of motion. He exhibits no edema or tenderness.  Lymphadenopathy:    He has no cervical adenopathy.  Neurological: He is alert and oriented to person, place, and time. He exhibits normal muscle tone. Coordination normal.  Skin: Skin is warm. No rash noted.  Tick attached to skin at the left axilla.  No significant surrounding erythema  Psychiatric: He has a normal mood and affect.  Nursing note and vitals reviewed.   ED Course  Procedures (including critical care time) Labs Review Labs Reviewed - No data to display  Imaging Review No results found. I have personally reviewed and evaluated these images and lab results as part of my medical decision-making.  EKG Interpretation None     Procedure: Area cleaned with alcohol and tick removed completely by me using tweezers.  Pt tolerated well.   MDM   Final diagnoses:  Tick bite with subsequent removal of tick   Pt advised to use OTC hydrocortisone cream if needed and to return for any signs of tick related illness.       Pauline Ausammy Anastasiya Gowin, PA-C 04/25/16 1308  Donnetta HutchingBrian Cook, MD 04/28/16 [phone]

## 2018-05-11 ENCOUNTER — Encounter (HOSPITAL_COMMUNITY): Payer: Self-pay | Admitting: Emergency Medicine

## 2018-05-11 ENCOUNTER — Emergency Department (HOSPITAL_COMMUNITY)
Admission: EM | Admit: 2018-05-11 | Discharge: 2018-05-11 | Disposition: A | Discharge status: Home / Self Care | Payer: Medicare Other | Attending: Emergency Medicine | Admitting: Emergency Medicine

## 2018-05-11 ENCOUNTER — Other Ambulatory Visit: Payer: Self-pay

## 2018-05-11 DIAGNOSIS — R21 Rash and other nonspecific skin eruption: Secondary | ICD-10-CM | POA: Insufficient documentation

## 2018-05-11 DIAGNOSIS — W57XXXA Bitten or stung by nonvenomous insect and other nonvenomous arthropods, initial encounter: Secondary | ICD-10-CM | POA: Diagnosis not present

## 2018-05-11 DIAGNOSIS — J45909 Unspecified asthma, uncomplicated: Secondary | ICD-10-CM | POA: Diagnosis not present

## 2018-05-11 MED ORDER — DOXYCYCLINE HYCLATE 100 MG PO CAPS: 100.0000 mg | ORAL_CAPSULE | Freq: Two times a day (BID) | ORAL | 0 refills | Status: AC

## 2018-05-11 MED ORDER — BACITRACIN ZINC 500 UNIT/GM EX OINT: 1.0000 "application " | TOPICAL_OINTMENT | Freq: Two times a day (BID) | CUTANEOUS | 0 refills | Status: AC

## 2018-05-11 NOTE — ED Notes (Addendum)
Pt has multiple tick bites. Has a swollen place on right under arm that is causing soreness and states pt did NOT get tick out. Also has lower left abdominal bite that is black on the surface. Pt concerned for tick's head still being embedded.

## 2018-05-11 NOTE — Discharge Instructions (Signed)
Please use the antibiotic as prescribed.  Make she follow-up the primary care doctor if symptoms not improving and return the ED if you develop any fevers, worsening rashes or worsening pain.

## 2018-05-11 NOTE — ED Provider Notes (Signed)
[facility] EMERGENCY DEPARTMENT Provider Note   CSN: [id number] Arrival date & time: 05/11/18  4098     History   Chief Complaint Chief Complaint  Patient presents with  . Rash    HPI Evan Ferguson is a 57 y.o. male.  HPI 57 year old African-American male past medical history significant for CHF and asthma presents to the emergency department today for evaluation of tick bites.  Patient states that he was at a junk [place] several days ago.  States that he had approximately 4-5 ticks landed on him.  Patient states that he pulled the takes off.  Was concerned that 1 of the head was remaining in his skin on 1 of the bites.  Patient states that the areas are slightly painful and pruritic.  Denies any associated fevers or other rash.  Patient states the tics were not engorged.  Patient is not sure how long the ticks were on him for.  Denies any purulent drainage.  Denies any associated fevers or chills.  He has not taken any over-the-counter medications for his symptoms.  Does not paresthesias. Past Medical History:  Diagnosis Date  . Asthma   . CHF (congestive heart failure) (HCC)     There are no active problems to display for this patient.   History reviewed. No pertinent surgical history.      Home Medications    Prior to Admission medications   Medication Sig Start Date End Date Taking? Authorizing Provider  albuterol (VENTOLIN HFA) 108 (90 BASE) MCG/ACT inhaler Inhale 2 puffs into the lungs every 6 (six) hours as needed for wheezing or shortness of breath.    [provider]  cyclobenzaprine (FLEXERIL) 10 MG tablet Take 1 tablet (10 mg total) by mouth 2 (two) times daily as needed for muscle spasms. 09/01/14   Donnetta Hutching, MD  naproxen (NAPROSYN) 500 MG tablet Take 1 tablet (500 mg total) by mouth 2 (two) times daily. 09/01/14   Donnetta Hutching, MD    Family History History reviewed. No pertinent family history.  Social History Social History   Tobacco Use  .  Smoking status: Current Some Day Smoker    Types: Cigarettes  Substance Use Topics  . Alcohol use: Yes    Comment: beer-2-3 cans a day  . Drug use: No     Allergies   Patient has no known allergies.   Review of Systems Review of Systems  All other systems reviewed and are negative.    Physical Exam Updated Vital Signs BP (!) 143/98 (BP Location: Right Arm)   Temp 97.6 F (36.4 C) (Oral)   Resp 18   Ht  (1.575 m)   Wt 54.4 kg (120 lb)   SpO2 100%   BMI 21.95 kg/m   Physical Exam  Constitutional: He appears well-developed and well-nourished. No distress.  HENT:  Head: Normocephalic and atraumatic.  Eyes: Right eye exhibits no discharge. Left eye exhibits no discharge. No scleral icterus.  Neck: Normal range of motion.  Pulmonary/Chest: No respiratory distress.  Musculoskeletal: Normal range of motion.  Neurological: He is alert.  Skin: Skin is warm and dry. Capillary refill takes less than 2 seconds. No pallor.  She has 2 erythematous lesions under the right armpit.  They are not fluctuant.  There is no drainage.  The area is not warm to touch.  Mildly tender to palpation.  No significant lymphadenopathy appreciated.  Patient also has bite above his right knee and to his left abdomen.  Patient  concerned that there is a head still in the left abdomen.  Scab is noted.  No signs of embedded tick head.  No other rash noted.  Psychiatric: His behavior is normal. Judgment and thought content normal.  Nursing note and vitals reviewed.    ED Treatments / Results  Labs (all labs ordered are listed, but only abnormal results are displayed) Labs Reviewed - No data to display  EKG None  Radiology No results found.  Procedures Procedures (including critical care time)  Medications Ordered in ED Medications - No data to display   Initial Impression / Assessment and Plan / ED Course  I have reviewed the triage vital signs and the nursing notes.  Pertinent labs  & imaging results that were available during my care of the patient were reviewed by me and considered in my medical decision making (see chart for details).     Patient presents to the emergency department today for evaluation of several tick bites to his body.  Patient is not sure how long the ticks were on for.  States they were not engorged.  Denies any associated systemic symptoms.  Patient was concerned that a head may still be in the tick bite on his left abdomen.  This was evaluated and a scab was present but no signs of the head of the tick was present.  No signs of drainable abscess at this time.  Will place patient on doxycycline topical antibiotic ointment.  Patient to follow-up with his primary care doctor in 2 to 3 days if symptoms not improving and return precautions were discussed.  Pt is hemodynamically stable, in NAD, & able to ambulate in the ED. Evaluation does not show pathology that would require ongoing emergent intervention or inpatient treatment. I explained the diagnosis to the patient. Pain has been managed & has no complaints prior to dc. Pt is comfortable with above plan and is stable for discharge at this time. All questions were answered prior to disposition. Strict return precautions for f/u to the ED were discussed. Encouraged follow up with PCP.   Final Clinical Impressions(s) / ED Diagnoses   Final diagnoses:  Tick bite, initial encounter    ED Discharge Orders        Ordered    doxycycline (VIBRAMYCIN) 100 MG capsule  2 times daily     05/11/18 1142    bacitracin ointment  2 times daily     05/11/18 1142       Rise Mu, PA-C 05/11/18 1142    Samuel Jester, DO 05/13/18 [phone]

## 2018-05-11 NOTE — ED Triage Notes (Signed)
Pt states multiple tick bites on body since yesterday.

## 2021-02-17 ENCOUNTER — Other Ambulatory Visit: Payer: Self-pay

## 2021-02-17 ENCOUNTER — Other Ambulatory Visit (HOSPITAL_COMMUNITY): Payer: Self-pay | Admitting: Gerontology

## 2021-02-17 ENCOUNTER — Ambulatory Visit (HOSPITAL_COMMUNITY)
Admission: RE | Admit: 2021-02-17 | Discharge: 2021-02-17 | Disposition: A | Discharge status: Home / Self Care | Payer: Medicare Other | Source: Ambulatory Visit | Attending: Gerontology | Admitting: Gerontology

## 2021-02-17 DIAGNOSIS — J40 Bronchitis, not specified as acute or chronic: Secondary | ICD-10-CM | POA: Diagnosis not present

## 2021-03-14 ENCOUNTER — Other Ambulatory Visit (HOSPITAL_COMMUNITY): Payer: Self-pay | Admitting: Radiology

## 2021-03-14 DIAGNOSIS — J441 Chronic obstructive pulmonary disease with (acute) exacerbation: Secondary | ICD-10-CM

## 2021-03-21 ENCOUNTER — Other Ambulatory Visit (HOSPITAL_COMMUNITY)
Admission: RE | Admit: 2021-03-21 | Discharge: 2021-03-21 | Disposition: A | Discharge status: Home / Self Care | Payer: Medicare Other | Source: Ambulatory Visit | Attending: Internal Medicine | Admitting: Internal Medicine

## 2021-03-21 NOTE — Progress Notes (Signed)
Called patient at the home # he gave [place], wrong #. A different person altogether. I called his mother, who he put as an alternate contact. No answer, I let it ring 7 or 8 times. I'll try her again later. Called patient's mother, phone rang and rang again. I tried calling for a third no answer, still. Patient's procedure is on Monday. He'll have to reschedule.

## 2021-03-24 ENCOUNTER — Inpatient Hospital Stay (HOSPITAL_COMMUNITY): Admission: RE | Admit: 2021-03-24 | Payer: Medicare Other | Source: Ambulatory Visit

## 2021-08-24 ENCOUNTER — Emergency Department (HOSPITAL_COMMUNITY)
Admission: EM | Admit: 2021-08-24 | Discharge: 2021-08-24 | Disposition: A | Discharge status: Home / Self Care | Payer: Medicare Other | Attending: Emergency Medicine | Admitting: Emergency Medicine

## 2021-08-24 DIAGNOSIS — R079 Chest pain, unspecified: Secondary | ICD-10-CM | POA: Insufficient documentation

## 2021-08-24 DIAGNOSIS — Z5321 Procedure and treatment not carried out due to patient leaving prior to being seen by health care provider: Secondary | ICD-10-CM | POA: Insufficient documentation
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
## 2022-06-07 ENCOUNTER — Emergency Department (HOSPITAL_COMMUNITY): Payer: Medicare Other

## 2022-06-07 ENCOUNTER — Encounter (HOSPITAL_COMMUNITY): Payer: Self-pay

## 2022-06-07 ENCOUNTER — Other Ambulatory Visit: Payer: Self-pay

## 2022-06-07 ENCOUNTER — Emergency Department (HOSPITAL_COMMUNITY)
Admission: EM | Admit: 2022-06-07 | Discharge: 2022-06-07 | Disposition: A | Discharge status: Home / Self Care | Payer: Medicare Other | Attending: Emergency Medicine | Admitting: Emergency Medicine

## 2022-06-07 DIAGNOSIS — M79631 Pain in right forearm: Secondary | ICD-10-CM | POA: Diagnosis not present

## 2022-06-07 DIAGNOSIS — J45909 Unspecified asthma, uncomplicated: Secondary | ICD-10-CM | POA: Diagnosis not present

## 2022-06-07 DIAGNOSIS — M25532 Pain in left wrist: Secondary | ICD-10-CM | POA: Diagnosis not present

## 2022-06-07 DIAGNOSIS — M79632 Pain in left forearm: Secondary | ICD-10-CM | POA: Insufficient documentation

## 2022-06-07 DIAGNOSIS — S0083XA Contusion of other part of head, initial encounter: Secondary | ICD-10-CM | POA: Insufficient documentation

## 2022-06-07 DIAGNOSIS — I509 Heart failure, unspecified: Secondary | ICD-10-CM | POA: Diagnosis not present

## 2022-06-07 DIAGNOSIS — M25521 Pain in right elbow: Secondary | ICD-10-CM | POA: Insufficient documentation

## 2022-06-07 DIAGNOSIS — S0993XA Unspecified injury of face, initial encounter: Secondary | ICD-10-CM | POA: Diagnosis present

## 2022-06-07 MED ORDER — IBUPROFEN 400 MG PO TABS
600.0000 mg | ORAL_TABLET | Freq: Once | ORAL | Status: AC
  Administered 2022-06-07: 600 mg via ORAL
  Filled 2022-06-07: qty 2
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
## 2023-09-30 ENCOUNTER — Encounter (HOSPITAL_COMMUNITY): Payer: Self-pay

## 2023-09-30 ENCOUNTER — Emergency Department (HOSPITAL_COMMUNITY)
Admission: EM | Admit: 2023-09-30 | Discharge: 2023-09-30 | Disposition: A | Discharge status: Home / Self Care | Payer: Medicare Other | Attending: Emergency Medicine | Admitting: Emergency Medicine

## 2023-09-30 ENCOUNTER — Other Ambulatory Visit: Payer: Self-pay

## 2023-09-30 DIAGNOSIS — H66001 Acute suppurative otitis media without spontaneous rupture of ear drum, right ear: Secondary | ICD-10-CM

## 2023-09-30 DIAGNOSIS — H938X3 Other specified disorders of ear, bilateral: Secondary | ICD-10-CM | POA: Diagnosis not present

## 2023-09-30 DIAGNOSIS — H9202 Otalgia, left ear: Secondary | ICD-10-CM | POA: Diagnosis present

## 2023-09-30 MED ORDER — FLUTICASONE PROPIONATE 50 MCG/ACT NA SUSP: 2.0000 | Freq: Every day | NASAL | 0 refills | Status: AC

## 2023-09-30 MED ORDER — AMOXICILLIN 875 MG PO TABS: 875.0000 mg | ORAL_TABLET | Freq: Two times a day (BID) | ORAL | 0 refills | Status: AC

## 2023-09-30 NOTE — Discharge Instructions (Addendum)
Seen in the ER today for decreased hearing in your right ear.  Your exam shows an ear infection.  We are treating with antibiotics and nasal spray which should hopefully help with your symptoms.  Follow-up closely with her primary care doctor for recheck, they can also recheck your blood pressure was was slightly elevated today.  Come back to the ER if you have new or worsening symptoms.

## 2023-09-30 NOTE — ED Provider Notes (Signed)
EMERGENCY DEPARTMENT AT [facility] Provider Note   CSN: [id number] Arrival date & time: 09/30/23  1511     History  Chief Complaint  Patient presents with   Ear Fullness    Evan Ferguson is a 62 y.o. male.  PMH of asthma and CHF.  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  Denies drainage or fever or trauma to the ear.  Denies headache.  Has prior episodes of this.   Ear Fullness       Home Medications Prior to Admission medications   Medication Sig Start Date End Date Taking? Authorizing Provider  albuterol (VENTOLIN HFA) 108 (90 BASE) MCG/ACT inhaler Inhale 2 puffs into the lungs every 6 (six) hours as needed for wheezing or shortness of breath.    [provider]  bacitracin ointment Apply 1 application topically 2 (two) times daily. 05/11/18   Rise Mu, PA-C  cyclobenzaprine (FLEXERIL) 10 MG tablet Take 1 tablet (10 mg total) by mouth 2 (two) times daily as needed for muscle spasms. 09/01/14   Donnetta Hutching, MD  doxycycline (VIBRAMYCIN) 100 MG capsule Take 1 capsule (100 mg total) by mouth 2 (two) times daily. 05/11/18   Rise Mu, PA-C  naproxen (NAPROSYN) 500 MG tablet Take 1 tablet (500 mg total) by mouth 2 (two) times daily. 09/01/14   Donnetta Hutching, MD      Allergies    Patient has no known allergies.    Review of Systems   Review of Systems  Physical Exam Updated Vital Signs BP (!) 146/104 (BP Location: Right Arm)   Pulse 89   Temp 98.9 F (37.2 C) (Oral)   Resp 16   Ht 5\' 2"  (1.575 m)   Wt 49.9 kg   SpO2 99%   BMI 20.12 kg/m  Physical Exam Vitals and nursing note reviewed.  Constitutional:      General: He is not in acute distress.    Appearance: He is well-developed.  HENT:     Head: Normocephalic and atraumatic.     Right Ear: No swelling or tenderness. A middle ear effusion is present. No foreign body. No mastoid tenderness. Tympanic  membrane is erythematous and bulging.     Left Ear: No swelling or tenderness. No foreign body. No mastoid tenderness. Tympanic membrane is injected.  Eyes:     Conjunctiva/sclera: Conjunctivae normal.  Cardiovascular:     Rate and Rhythm: Normal rate and regular rhythm.     Heart sounds: No murmur heard. Pulmonary:     Effort: Pulmonary effort is normal. No respiratory distress.     Breath sounds: Normal breath sounds.  Abdominal:     Palpations: Abdomen is soft.     Tenderness: There is no abdominal tenderness.  Musculoskeletal:        General: No swelling.     Cervical back: Neck supple.  Lymphadenopathy:     Cervical: No cervical adenopathy.  Skin:    General: Skin is warm and dry.     Capillary Refill: Capillary refill takes less than 2 seconds.  Neurological:     Mental Status: He is alert.  Psychiatric:        Mood and Affect: Mood normal.     ED Results / Procedures / Treatments   Labs (all labs ordered are listed, but only abnormal results are displayed) Labs Reviewed - No data to display  EKG None  Radiology No results found.  Procedures Procedures    Medications Ordered in ED Medications - No data to display  ED Course/ Medical Decision Making/ A&P                                 Medical Decision Making DDx: Otitis media, cerumen impaction, otitis externa, foreign body of EAC, other  ED course: Patient presents with muffled hearing in the right side since last night, denies significant pain, no fevers, no URI symptoms.  Noted on exam to have findings consistent with acute otitis media.  Discussed with patient we will treat with antibiotics and Flonase.  We discussed the use of the Flonase that it goes in his nose how to use it and how will help his symptoms.  Advised on PCP follow-up and strict return precautions.           Final Clinical Impression(s) / ED Diagnoses Final diagnoses:  None    Rx / DC Orders ED Discharge Orders      None         Ma Rings, PA-C 09/30/23 1714    Terrilee Files, MD 10/01/23 1019

## 2023-09-30 NOTE — ED Triage Notes (Signed)
Pt reports he feels like his right ear is clogged or has something in it.
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
## 2024-02-09 ENCOUNTER — Other Ambulatory Visit (HOSPITAL_COMMUNITY)
Admission: RE | Admit: 2024-02-09 | Discharge: 2024-02-09 | Disposition: A | Discharge status: Home / Self Care | Payer: Medicare Other | Source: Ambulatory Visit | Attending: Gerontology | Admitting: Gerontology

## 2024-02-09 ENCOUNTER — Other Ambulatory Visit (HOSPITAL_COMMUNITY): Payer: Self-pay | Admitting: Gerontology

## 2024-02-09 ENCOUNTER — Other Ambulatory Visit: Payer: Self-pay | Admitting: Internal Medicine

## 2024-02-09 ENCOUNTER — Ambulatory Visit (HOSPITAL_COMMUNITY)
Admission: RE | Admit: 2024-02-09 | Discharge: 2024-02-09 | Disposition: A | Discharge status: Home / Self Care | Payer: Medicare Other | Source: Ambulatory Visit | Attending: Gerontology | Admitting: Gerontology

## 2024-02-09 DIAGNOSIS — Z1159 Encounter for screening for other viral diseases: Secondary | ICD-10-CM | POA: Diagnosis not present

## 2024-02-09 DIAGNOSIS — E46 Unspecified protein-calorie malnutrition: Secondary | ICD-10-CM | POA: Diagnosis not present

## 2024-02-09 DIAGNOSIS — R059 Cough, unspecified: Secondary | ICD-10-CM | POA: Insufficient documentation

## 2024-02-09 DIAGNOSIS — J439 Emphysema, unspecified: Secondary | ICD-10-CM | POA: Insufficient documentation

## 2024-02-09 DIAGNOSIS — I10 Essential (primary) hypertension: Secondary | ICD-10-CM | POA: Insufficient documentation

## 2024-02-09 DIAGNOSIS — R7303 Prediabetes: Secondary | ICD-10-CM | POA: Insufficient documentation

## 2024-02-09 DIAGNOSIS — N390 Urinary tract infection, site not specified: Secondary | ICD-10-CM | POA: Insufficient documentation

## 2024-02-09 DIAGNOSIS — Z125 Encounter for screening for malignant neoplasm of prostate: Secondary | ICD-10-CM | POA: Diagnosis not present

## 2024-02-09 LAB — URINALYSIS, W/ REFLEX TO CULTURE (INFECTION SUSPECTED)
Bacteria, UA: NONE SEEN
Bilirubin Urine: NEGATIVE
Glucose, UA: NEGATIVE mg/dL
Hgb urine dipstick: NEGATIVE
Ketones, ur: NEGATIVE mg/dL
Leukocytes,Ua: NEGATIVE
Nitrite: NEGATIVE
Protein, ur: NEGATIVE mg/dL
Specific Gravity, Urine: 1.014 (ref 1.005–1.030)
pH: 7 (ref 5.0–8.0)

## 2024-02-09 LAB — CBC WITH DIFFERENTIAL/PLATELET
Abs Immature Granulocytes: 0.01 10*3/uL (ref 0.00–0.07)
Basophils Absolute: 0 10*3/uL (ref 0.0–0.1)
Basophils Relative: 1 %
Eosinophils Absolute: 0.1 10*3/uL (ref 0.0–0.5)
Eosinophils Relative: 2 %
HCT: 47.5 % (ref 39.0–52.0)
Hemoglobin: 15.7 g/dL (ref 13.0–17.0)
Immature Granulocytes: 0 %
Lymphocytes Relative: 49 %
Lymphs Abs: 2.5 10*3/uL (ref 0.7–4.0)
MCH: 29.8 pg (ref 26.0–34.0)
MCHC: 33.1 g/dL (ref 30.0–36.0)
MCV: 90.3 fL (ref 80.0–100.0)
Monocytes Absolute: 0.4 10*3/uL (ref 0.1–1.0)
Monocytes Relative: 9 %
Neutro Abs: 2 10*3/uL (ref 1.7–7.7)
Neutrophils Relative %: 39 %
Platelets: 345 10*3/uL (ref 150–400)
RBC: 5.26 MIL/uL (ref 4.22–5.81)
RDW: 14.3 % (ref 11.5–15.5)
WBC: 5.1 10*3/uL (ref 4.0–10.5)
nRBC: 0 % (ref 0.0–0.2)

## 2024-02-09 LAB — BASIC METABOLIC PANEL
Anion gap: 13 (ref 5–15)
BUN: 11 mg/dL (ref 8–23)
CO2: 24 mmol/L (ref 22–32)
Calcium: 9.9 mg/dL (ref 8.9–10.3)
Chloride: 103 mmol/L (ref 98–111)
Creatinine, Ser: 0.72 mg/dL (ref 0.61–1.24)
GFR, Estimated: >60 mL/min (ref >60)
Glucose, Bld: 92 mg/dL (ref 70–99)
Potassium: 4.2 mmol/L (ref 3.5–5.1)
Sodium: 140 mmol/L (ref 135–145)

## 2024-02-09 LAB — LIPID PANEL
Cholesterol: 272 mg/dL — ABNORMAL HIGH (ref 0–200)
HDL: 128 mg/dL (ref >40)
LDL Cholesterol: 126 mg/dL — ABNORMAL HIGH (ref 0–99)
Total CHOL/HDL Ratio: 2.1 {ratio}
Triglycerides: 92 mg/dL (ref <150)
VLDL: 18 mg/dL (ref 0–40)

## 2024-02-09 LAB — HEPATIC FUNCTION PANEL
ALT: 25 U/L (ref 0–44)
AST: 31 U/L (ref 15–41)
Albumin: 4.6 g/dL (ref 3.5–5.0)
Alkaline Phosphatase: 77 U/L (ref 38–126)
Bilirubin, Direct: 0.1 mg/dL (ref 0.0–0.2)
Indirect Bilirubin: 0.8 mg/dL (ref 0.3–0.9)
Total Bilirubin: 0.9 mg/dL (ref 0.0–1.2)
Total Protein: 7.9 g/dL (ref 6.5–8.1)

## 2024-02-09 LAB — HEMOGLOBIN A1C
Hgb A1c MFr Bld: 5.9 % — ABNORMAL HIGH (ref 4.8–5.6)
Mean Plasma Glucose: 122.63 mg/dL

## 2024-02-09 LAB — PSA: Prostatic Specific Antigen: 2.99 ng/mL (ref 0.00–4.00)

## 2024-02-09 LAB — HEPATITIS B SURFACE ANTIGEN: Hepatitis B Surface Ag: NONREACTIVE

## 2024-02-10 ENCOUNTER — Other Ambulatory Visit (HOSPITAL_COMMUNITY)
Admission: RE | Admit: 2024-02-10 | Discharge: 2024-02-10 | Disposition: A | Discharge status: Home / Self Care | Payer: Medicare Other | Source: Ambulatory Visit | Attending: Internal Medicine | Admitting: Internal Medicine

## 2024-02-10 DIAGNOSIS — R739 Hyperglycemia, unspecified: Secondary | ICD-10-CM | POA: Diagnosis present

## 2024-02-10 DIAGNOSIS — Z0001 Encounter for general adult medical examination with abnormal findings: Secondary | ICD-10-CM | POA: Diagnosis present

## 2024-02-10 DIAGNOSIS — Z1159 Encounter for screening for other viral diseases: Secondary | ICD-10-CM | POA: Insufficient documentation

## 2024-02-10 DIAGNOSIS — Z113 Encounter for screening for infections with a predominantly sexual mode of transmission: Secondary | ICD-10-CM | POA: Diagnosis present

## 2024-02-10 DIAGNOSIS — Z125 Encounter for screening for malignant neoplasm of prostate: Secondary | ICD-10-CM | POA: Diagnosis present

## 2024-02-10 DIAGNOSIS — N390 Urinary tract infection, site not specified: Secondary | ICD-10-CM | POA: Insufficient documentation

## 2024-02-10 LAB — HCV RNA QUANT: HCV Quantitative: NOT DETECTED [IU]/mL (ref >50)

## 2024-02-11 LAB — MOLECULAR ANCILLARY ONLY
Chlamydia: NEGATIVE
Comment: NEGATIVE
Comment: NORMAL
Neisseria Gonorrhea: NEGATIVE

## 2024-10-12 ENCOUNTER — Encounter (HOSPITAL_COMMUNITY): Payer: Self-pay

## 2024-10-12 ENCOUNTER — Emergency Department (HOSPITAL_COMMUNITY)
Admission: EM | Admit: 2024-10-12 | Discharge: 2024-10-12 | Disposition: A | Discharge status: Home / Self Care | Attending: Emergency Medicine | Admitting: Emergency Medicine

## 2024-10-12 ENCOUNTER — Emergency Department (HOSPITAL_COMMUNITY)

## 2024-10-12 ENCOUNTER — Other Ambulatory Visit: Payer: Self-pay

## 2024-10-12 DIAGNOSIS — Z8673 Personal history of transient ischemic attack (TIA), and cerebral infarction without residual deficits: Secondary | ICD-10-CM | POA: Diagnosis not present

## 2024-10-12 DIAGNOSIS — Y908 Blood alcohol level of 240 mg/100 ml or more: Secondary | ICD-10-CM | POA: Diagnosis not present

## 2024-10-12 DIAGNOSIS — R7309 Other abnormal glucose: Secondary | ICD-10-CM | POA: Insufficient documentation

## 2024-10-12 DIAGNOSIS — W01198A Fall on same level from slipping, tripping and stumbling with subsequent striking against other object, initial encounter: Secondary | ICD-10-CM | POA: Insufficient documentation

## 2024-10-12 DIAGNOSIS — J328 Other chronic sinusitis: Secondary | ICD-10-CM | POA: Insufficient documentation

## 2024-10-12 DIAGNOSIS — S0990XA Unspecified injury of head, initial encounter: Secondary | ICD-10-CM | POA: Insufficient documentation

## 2024-10-12 DIAGNOSIS — R45850 Homicidal ideations: Secondary | ICD-10-CM | POA: Diagnosis present

## 2024-10-12 DIAGNOSIS — F10921 Alcohol use, unspecified with intoxication delirium: Secondary | ICD-10-CM

## 2024-10-12 DIAGNOSIS — F10120 Alcohol abuse with intoxication, uncomplicated: Secondary | ICD-10-CM | POA: Insufficient documentation

## 2024-10-12 DIAGNOSIS — Z79899 Other long term (current) drug therapy: Secondary | ICD-10-CM | POA: Insufficient documentation

## 2024-10-12 DIAGNOSIS — S069X1A Unspecified intracranial injury with loss of consciousness of 30 minutes or less, initial encounter: Secondary | ICD-10-CM | POA: Diagnosis present

## 2024-10-12 DIAGNOSIS — W1809XA Striking against other object with subsequent fall, initial encounter: Secondary | ICD-10-CM | POA: Insufficient documentation

## 2024-10-12 LAB — COMPREHENSIVE METABOLIC PANEL WITH GFR
ALT: 20 U/L (ref 0–44)
AST: 29 U/L (ref 15–41)
Albumin: 4.9 g/dL (ref 3.5–5.0)
Alkaline Phosphatase: 77 U/L (ref 38–126)
Anion gap: 15 (ref 5–15)
BUN: 11 mg/dL (ref 8–23)
CO2: 25 mmol/L (ref 22–32)
Calcium: 9.4 mg/dL (ref 8.9–10.3)
Chloride: 100 mmol/L (ref 98–111)
Creatinine, Ser: 0.8 mg/dL (ref 0.61–1.24)
GFR, Estimated: >60 mL/min (ref >60)
Glucose, Bld: 107 mg/dL — ABNORMAL HIGH (ref 70–99)
Potassium: 4.3 mmol/L (ref 3.5–5.1)
Sodium: 140 mmol/L (ref 135–145)
Total Bilirubin: 0.3 mg/dL (ref 0.0–1.2)
Total Protein: 8.2 g/dL — ABNORMAL HIGH (ref 6.5–8.1)

## 2024-10-12 LAB — CBC WITH DIFFERENTIAL/PLATELET
Abs Immature Granulocytes: 0.01 K/uL (ref 0.00–0.07)
Basophils Absolute: 0 K/uL (ref 0.0–0.1)
Basophils Relative: 0 %
Eosinophils Absolute: 0.1 K/uL (ref 0.0–0.5)
Eosinophils Relative: 1 %
HCT: 49.8 % (ref 39.0–52.0)
Hemoglobin: 16.5 g/dL (ref 13.0–17.0)
Immature Granulocytes: 0 %
Lymphocytes Relative: 63 %
Lymphs Abs: 3.6 K/uL (ref 0.7–4.0)
MCH: 30.1 pg (ref 26.0–34.0)
MCHC: 33.1 g/dL (ref 30.0–36.0)
MCV: 90.9 fL (ref 80.0–100.0)
Monocytes Absolute: 0.3 K/uL (ref 0.1–1.0)
Monocytes Relative: 5 %
Neutro Abs: 1.8 K/uL (ref 1.7–7.7)
Neutrophils Relative %: 31 %
Platelets: 385 K/uL (ref 150–400)
RBC: 5.48 MIL/uL (ref 4.22–5.81)
RDW: 13.5 % (ref 11.5–15.5)
WBC: 5.7 K/uL (ref 4.0–10.5)
nRBC: 0 % (ref 0.0–0.2)

## 2024-10-12 LAB — ETHANOL: Alcohol, Ethyl (B): 341 mg/dL (ref <15)

## 2024-10-12 LAB — CBG MONITORING, ED: Glucose-Capillary: 90 mg/dL (ref 70–99)

## 2024-10-12 MED ORDER — ZIPRASIDONE MESYLATE 20 MG IM SOLR
INTRAMUSCULAR | Status: AC
  Administered 2024-10-12: 10 mg via INTRAMUSCULAR
  Filled 2024-10-12: qty 20

## 2024-10-12 MED ORDER — STERILE WATER FOR INJECTION IJ SOLN
INTRAMUSCULAR | Status: AC
  Administered 2024-10-12: 10 mL
  Filled 2024-10-12: qty 10

## 2024-10-12 MED ORDER — ZIPRASIDONE MESYLATE 20 MG IM SOLR
10.0000 mg | Freq: Once | INTRAMUSCULAR | Status: AC
Start: 2024-10-12 — End: 2024-10-12
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
## 2024-10-12 NOTE — ED Notes (Signed)
 Patient transported to CT

## 2024-10-12 NOTE — ED Notes (Signed)
 Pt ambulatory at discharge, offered food and water but declined. Sent to lobby to wait for moving on [facility] transport home.

## 2024-10-12 NOTE — ED Provider Notes (Signed)
 Signout from Dr. Patsey.  63 year old male here with fall altered mental status.  Alcohol level elevated.  Plan is to reassess as more sober to see if appropriate for discharge Physical Exam  BP 125/84   Pulse (!) 57   Temp 97.6 F (36.4 C)   Resp 14   Ht 5' 2 (1.575 m)   Wt 50 kg   SpO2 92%   BMI 20.16 kg/m   Physical Exam  Procedures  Procedures  ED Course / MDM    Medical Decision Making Amount and/or Complexity of Data Reviewed Labs: ordered. Radiology: ordered. ECG/medicine tests: ordered.  Risk Prescription drug management.   4:45 PM.  Patient is awake and alert.  He said he would like to be discharged.  He said he is can walk home.       Towana Ozell BROCKS, MD 10/12/24 [phone]

## 2024-10-12 NOTE — ED Notes (Signed)
 IV wrapped with Coban and Mitten put on patient hands to help prevent patient from removing IV, from his confusion.

## 2024-10-12 NOTE — ED Notes (Signed)
 Pt ambulated around nursing station of ED, tolerated well. Pt denies dizziness and weakness.
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
## 2024-10-12 NOTE — ED Provider Notes (Signed)
  Physical Exam  BP 124/80   Pulse 60   Temp 97.6 F (36.4 C) (Oral)   Resp 13   Ht 5' 2 (1.575 m)   Wt 50 kg   SpO2 96%   BMI 20.16 kg/m   Physical Exam  Procedures  Procedures  ED Course / MDM    Medical Decision Making Amount and/or Complexity of Data Reviewed Labs: ordered. Radiology: ordered. ECG/medicine tests: ordered.  Risk Prescription drug management.   CIWA signout.  Potential alcohol intoxication.  Had been belligerent last night.  Now has been sleeping most the day.  More awake now.  Will attempt to ambulate and if ambulates likely discharge home.  Patient appears calm now       Patsey Lot, MD 10/12/24 1521

## 2024-10-12 NOTE — Discharge Instructions (Signed)
 Please rest and drink plenty of fluids.  Alcohol in moderation.  Follow-up with your primary care doctor.

## 2024-12-28 ENCOUNTER — Other Ambulatory Visit: Payer: Self-pay

## 2024-12-28 ENCOUNTER — Emergency Department (HOSPITAL_COMMUNITY)
Admission: EM | Admit: 2024-12-28 | Discharge: 2024-12-29 | Disposition: A | Discharge status: Home / Self Care | Attending: Emergency Medicine | Admitting: Emergency Medicine

## 2024-12-28 ENCOUNTER — Emergency Department (HOSPITAL_COMMUNITY)

## 2024-12-28 ENCOUNTER — Encounter (HOSPITAL_COMMUNITY): Payer: Self-pay

## 2024-12-28 DIAGNOSIS — W19XXXA Unspecified fall, initial encounter: Secondary | ICD-10-CM

## 2024-12-28 DIAGNOSIS — S0083XA Contusion of other part of head, initial encounter: Secondary | ICD-10-CM | POA: Diagnosis not present

## 2024-12-28 DIAGNOSIS — Y908 Blood alcohol level of 240 mg/100 ml or more: Secondary | ICD-10-CM | POA: Insufficient documentation

## 2024-12-28 DIAGNOSIS — R4182 Altered mental status, unspecified: Secondary | ICD-10-CM | POA: Diagnosis present

## 2024-12-28 DIAGNOSIS — F1010 Alcohol abuse, uncomplicated: Secondary | ICD-10-CM | POA: Diagnosis not present

## 2024-12-28 DIAGNOSIS — Y92410 Unspecified street and highway as the place of occurrence of the external cause: Secondary | ICD-10-CM | POA: Diagnosis not present

## 2024-12-28 DIAGNOSIS — F1990 Other psychoactive substance use, unspecified, uncomplicated: Secondary | ICD-10-CM

## 2024-12-28 LAB — COMPREHENSIVE METABOLIC PANEL WITH GFR
ALT: 13 U/L (ref 0–44)
AST: 21 U/L (ref 15–41)
Albumin: 4.3 g/dL (ref 3.5–5.0)
Alkaline Phosphatase: 85 U/L (ref 38–126)
Anion gap: 13 (ref 5–15)
BUN: 9 mg/dL (ref 8–23)
CO2: 22 mmol/L (ref 22–32)
Calcium: 8.5 mg/dL — ABNORMAL LOW (ref 8.9–10.3)
Chloride: 110 mmol/L (ref 98–111)
Creatinine, Ser: 0.64 mg/dL (ref 0.61–1.24)
GFR, Estimated: >60 mL/min
Glucose, Bld: 81 mg/dL (ref 70–99)
Potassium: 3.8 mmol/L (ref 3.5–5.1)
Sodium: 145 mmol/L (ref 135–145)
Total Bilirubin: <0.2 mg/dL (ref 0.0–1.2)
Total Protein: 6.7 g/dL (ref 6.5–8.1)

## 2024-12-28 LAB — CBC WITH DIFFERENTIAL/PLATELET
Abs Immature Granulocytes: 0.04 K/uL (ref 0.00–0.07)
Basophils Absolute: 0 K/uL (ref 0.0–0.1)
Basophils Relative: 1 %
Eosinophils Absolute: 0.1 K/uL (ref 0.0–0.5)
Eosinophils Relative: 1 %
HCT: 47.7 % (ref 39.0–52.0)
Hemoglobin: 14.5 g/dL (ref 13.0–17.0)
Immature Granulocytes: 1 %
Lymphocytes Relative: 50 %
Lymphs Abs: 3.3 K/uL (ref 0.7–4.0)
MCH: 29.2 pg (ref 26.0–34.0)
MCHC: 30.4 g/dL (ref 30.0–36.0)
MCV: 96.2 fL (ref 80.0–100.0)
Monocytes Absolute: 0.5 K/uL (ref 0.1–1.0)
Monocytes Relative: 8 %
Neutro Abs: 2.5 K/uL (ref 1.7–7.7)
Neutrophils Relative %: 39 %
Platelets: 237 K/uL (ref 150–400)
RBC: 4.96 MIL/uL (ref 4.22–5.81)
RDW: 15.4 % (ref 11.5–15.5)
WBC: 6.3 K/uL (ref 4.0–10.5)
nRBC: 0 % (ref 0.0–0.2)

## 2024-12-28 LAB — ETHANOL: Alcohol, Ethyl (B): 259 mg/dL — ABNORMAL HIGH

## 2024-12-28 MED ORDER — STERILE WATER FOR INJECTION IJ SOLN
INTRAMUSCULAR | Status: AC
  Filled 2024-12-28: qty 10

## 2024-12-28 MED ORDER — SODIUM CHLORIDE 0.9 % IV BOLUS
1000.0000 mL | Freq: Once | INTRAVENOUS | Status: AC
  Administered 2024-12-28: 1000 mL via INTRAVENOUS

## 2024-12-28 MED ORDER — ZIPRASIDONE MESYLATE 20 MG IM SOLR
10.0000 mg | Freq: Once | INTRAMUSCULAR | Status: AC
  Administered 2024-12-28: 10 mg via INTRAMUSCULAR
  Filled 2024-12-28: qty 20
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
## 2024-12-28 NOTE — ED Notes (Signed)
 MD notified of pt refusing all labs, trying to get out of bed. Pt educated on importance of labs. Pt still refusing.

## 2024-12-28 NOTE — Progress Notes (Signed)
 CT head reviewed - appears to have scattered calcifications along the falx. I do not appreciate a SDH. If there is truly a SDH there, it is inconsequential  Nothing to do. No further imaging needed. No medication restrictions. Can follow-up prn
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
## 2024-12-28 NOTE — ED Notes (Signed)
 Pt attempting to climb out of bed; pt is confused and asked this nurse where he was at again. Pt was redirectable and placed back in the bed. Pt resting comfortably with sitter in room.

## 2024-12-28 NOTE — ED Notes (Signed)
 Pt is currently sleeping. Chest rise and fall seen.

## 2024-12-29 NOTE — ED Notes (Signed)
 Pt wheeled to restroom. Sheets changed on bed.

## 2024-12-29 NOTE — ED Notes (Signed)
 Pt continues to sleep. Chest rise and fall seen.
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
## 2024-12-29 NOTE — Discharge Instructions (Addendum)
 Stay on your medications as prescribed.  Follow-up with your primary care doctor.  Return to the ER for new or worsening symptoms.
